# Patient Record
Sex: Male | Born: 2000
Health system: Southern US, Community
[De-identification: ages and names within clinical notes are randomized; demographics above are authoritative.]

## PROBLEM LIST (undated history)

## (undated) DIAGNOSIS — J45909 Unspecified asthma, uncomplicated: Secondary | ICD-10-CM

## (undated) DIAGNOSIS — F909 Attention-deficit hyperactivity disorder, unspecified type: Secondary | ICD-10-CM

---
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
## 2022-04-24 ENCOUNTER — Encounter (HOSPITAL_COMMUNITY): Payer: Self-pay

## 2022-04-24 ENCOUNTER — Emergency Department (HOSPITAL_COMMUNITY)
Admission: EM | Admit: 2022-04-24 | Discharge: 2022-04-24 | Discharge status: Against Medical Advice | Payer: Federal, State, Local not specified - PPO | Attending: Emergency Medicine | Admitting: Emergency Medicine

## 2022-04-24 DIAGNOSIS — Z5321 Procedure and treatment not carried out due to patient leaving prior to being seen by health care provider: Secondary | ICD-10-CM | POA: Diagnosis not present

## 2022-04-24 DIAGNOSIS — R42 Dizziness and giddiness: Secondary | ICD-10-CM | POA: Insufficient documentation

## 2022-04-24 LAB — CBC
HCT: 51.4 % (ref 39.0–52.0)
Hemoglobin: 17.1 g/dL — ABNORMAL HIGH (ref 13.0–17.0)
MCH: 28.6 pg (ref 26.0–34.0)
MCHC: 33.3 g/dL (ref 30.0–36.0)
MCV: 86 fL (ref 80.0–100.0)
Platelets: 206 10*3/uL (ref 150–400)
RBC: 5.98 MIL/uL — ABNORMAL HIGH (ref 4.22–5.81)
RDW: 13.3 % (ref 11.5–15.5)
WBC: 5.9 10*3/uL (ref 4.0–10.5)
nRBC: 0 % (ref 0.0–0.2)

## 2022-04-24 LAB — BASIC METABOLIC PANEL
Anion gap: 7 (ref 5–15)
BUN: 16 mg/dL (ref 6–20)
CO2: 27 mmol/L (ref 22–32)
Calcium: 9.7 mg/dL (ref 8.9–10.3)
Chloride: 103 mmol/L (ref 98–111)
Creatinine, Ser: 1.09 mg/dL (ref 0.61–1.24)
GFR, Estimated: >60 mL/min (ref >60)
Glucose, Bld: 90 mg/dL (ref 70–99)
Potassium: 4 mmol/L (ref 3.5–5.1)
Sodium: 137 mmol/L (ref 135–145)

## 2022-04-24 LAB — MAGNESIUM: Magnesium: 2.1 mg/dL (ref 1.7–2.4)

## 2022-04-24 NOTE — ED Provider Triage Note (Signed)
Emergency Medicine Provider Triage Evaluation Note  Jason Simpson , a 21 y.o. male  was evaluated in triage.  Pt complains of dizziness which began 2 days ago.  Scribes the entire room is spinning, states this is exacerbated with lying flat as he feels like his eyes focusing because the room to spin more.  Alleviated with sitting up, reports he has been falling asleep sitting up.  Does not have any prior history of heart disease.  No history of IV drug use.  Denies any prior history of tobacco use  Review of Systems  Positive: dizziness Negative: Chest pain, sob  Physical Exam  BP (!) 146/70 (BP Location: Left Arm)   Pulse 89   Temp 99.4 F (37.4 C) (Oral)   Resp 18   Ht 6' (1.829 m)   Wt 106.1 kg   SpO2 99%   BMI 31.74 kg/m  Gen:   Awake, no distress   Resp:  Normal effort  MSK:   Moves extremities without difficulty  Other:    Medical Decision Making  Medically screening exam initiated at 3:40 PM.  Appropriate orders placed.  MARCELO ICKES was informed that the remainder of the evaluation will be completed by another provider, this initial triage assessment does not replace that evaluation, and the importance of remaining in the ED until their evaluation is complete.     Janeece Fitting, PA-C 04/24/22 1544

## 2022-04-24 NOTE — ED Triage Notes (Signed)
Pt arrived via POV, c/o dizziness. States hx of vertigo, but this feels slightly different than normal vertigo sx.

## 2022-04-25 ENCOUNTER — Emergency Department (HOSPITAL_COMMUNITY): Payer: Federal, State, Local not specified - PPO

## 2022-04-25 ENCOUNTER — Encounter (HOSPITAL_BASED_OUTPATIENT_CLINIC_OR_DEPARTMENT_OTHER): Payer: Self-pay

## 2022-04-25 ENCOUNTER — Emergency Department (HOSPITAL_BASED_OUTPATIENT_CLINIC_OR_DEPARTMENT_OTHER)
Admission: EM | Admit: 2022-04-25 | Discharge: 2022-04-25 | Disposition: A | Discharge status: Home / Self Care | Payer: Federal, State, Local not specified - PPO | Attending: Emergency Medicine | Admitting: Emergency Medicine

## 2022-04-25 ENCOUNTER — Other Ambulatory Visit: Payer: Self-pay

## 2022-04-25 DIAGNOSIS — R42 Dizziness and giddiness: Secondary | ICD-10-CM | POA: Insufficient documentation

## 2022-04-25 MED ORDER — MECLIZINE HCL 25 MG PO TABS
25.0000 mg | ORAL_TABLET | Freq: Once | ORAL | Status: AC
  Administered 2022-04-25: 25 mg via ORAL
  Filled 2022-04-25: qty 1

## 2022-04-25 MED ORDER — MECLIZINE HCL 25 MG PO TABS: 25.0000 mg | ORAL_TABLET | Freq: Three times a day (TID) | ORAL | 0 refills | Status: DC | PRN

## 2022-04-25 MED ORDER — GADOPICLENOL 0.5 MMOL/ML IV SOLN
10.0000 mL | Freq: Once | INTRAVENOUS | Status: AC | PRN
  Administered 2022-04-25: 10 mL via INTRAVENOUS

## 2022-04-25 NOTE — ED Notes (Signed)
Handoff report given to [facility] 

## 2022-04-25 NOTE — ED Triage Notes (Signed)
Onset three days of dizziness worse while lying down.  Fullness in head  States "like my head is in a fog"

## 2022-04-25 NOTE — ED Notes (Signed)
Handoff report given to [facility] [facility] ER

## 2022-04-25 NOTE — ED Notes (Signed)
Rassessed patient's dizziness. No improvement with meds. MD aware.

## 2022-04-25 NOTE — ED Notes (Signed)
Pt arrives via [facility] from [place] here for MRI and further eval of dizziness. Given meclizine earlier without improvement.

## 2022-04-25 NOTE — ED Notes (Signed)
[facility] at bedside 

## 2022-04-25 NOTE — ED Provider Notes (Signed)
[facility] EMERGENCY DEPARTMENT Provider Note   CSN: [id number] Arrival date & time: 04/25/22  0809     History Chief Complaint  Patient presents with   Dizziness    HPI Jason Simpson is a 21 y.o. male presenting for episodes of dizziness.  He has a minimal medical history.  He endorses 3 days of paroxysms of dizziness worse with motion.  He denies presyncope but endorses vertiginous symptoms of spinning.  He denies fevers or chills nausea or vomiting, syncope shortness of breath.  Is otherwise ambulatory tolerating p.o. intake.  He states its worse when he moves or sits up from laying down or rolls over to his side.  Denies any other medical problems.   Patient's recorded medical, surgical, social, medication list and allergies were reviewed in the Snapshot window as part of the initial history.   Review of Systems   Review of Systems  Constitutional:  Negative for chills and fever.  HENT:  Negative for ear pain and sore throat.   Eyes:  Negative for pain and visual disturbance.  Respiratory:  Negative for cough and shortness of breath.   Cardiovascular:  Negative for chest pain and palpitations.  Gastrointestinal:  Negative for abdominal pain and vomiting.  Genitourinary:  Negative for dysuria and hematuria.  Musculoskeletal:  Negative for arthralgias and back pain.  Skin:  Negative for color change and rash.  Neurological:  Positive for dizziness. Negative for seizures and syncope.  All other systems reviewed and are negative.   Physical Exam Updated Vital Signs BP 131/88 (BP Location: Right Arm)   Pulse 65   Temp 98 F (36.7 C) (Oral)   Resp (!) 22   Ht 6' (1.829 m)   Wt 106.1 kg   SpO2 100%   BMI 31.72 kg/m  Physical Exam Vitals and nursing note reviewed.  Constitutional:      General: He is not in acute distress.    Appearance: He is well-developed.  HENT:     Head: Normocephalic and atraumatic.  Eyes:     Conjunctiva/sclera:  Conjunctivae normal.  Cardiovascular:     Rate and Rhythm: Normal rate and regular rhythm.     Heart sounds: No murmur heard. Pulmonary:     Effort: Pulmonary effort is normal. No respiratory distress.     Breath sounds: Normal breath sounds.  Abdominal:     Palpations: Abdomen is soft.     Tenderness: There is no abdominal tenderness.  Musculoskeletal:        General: No swelling.     Cervical back: Neck supple.  Skin:    General: Skin is warm and dry.     Capillary Refill: Capillary refill takes less than 2 seconds.  Neurological:     General: No focal deficit present.     Mental Status: He is alert and oriented to person, place, and time.     Cranial Nerves: No cranial nerve deficit.     Motor: No weakness.     Coordination: Coordination normal.     Gait: Gait normal.     Deep Tendon Reflexes: Reflexes normal.     Comments: Head impulse test with a corrective saccade Nystagmus with leftward beats of nystagmus provoked with head rotation Test of skew negative   Psychiatric:        Mood and Affect: Mood normal.      ED Course/ Medical Decision Making/ A&P    Procedures Procedures   Medications Ordered in ED Medications  meclizine (ANTIVERT) tablet 25 mg (25 mg Oral Given 04/25/22 0915)  gadopiclenol (VUEWAY) 0.5 MMOL/ML solution 10 mL (10 mLs Intravenous Contrast Given 04/25/22 1338)    Medical Decision Making:    Jason Simpson is a 21 y.o. male who presented to the ED today with dizziness detailed above.     Patient's presentation is complicated by their history of migraines.  Patient placed on continuous vitals and telemetry monitoring while in ED which was reviewed periodically.   Complete initial physical exam performed, notably the patient  was HDS in NAD.  Hints exam reassuring at this time   Reviewed and confirmed nursing documentation for past medical history, family history, social history.    Initial Assessment:   With the patient's presentation  of dizziness, reassuring hints, most likely diagnosis is peripheral etiology of vertigo including vestibular neuritis versus BPPV. Other diagnoses were considered including (but not limited to) atypical vestibular migraines, CVA, vascular dissection. These are considered less likely due to history of present illness and physical exam findings.   This is most consistent with an acute life/limb threatening illness complicated by underlying chronic conditions.  Initial Plan:  Patient consulted with neurology by outside emergency department, they recommended MRIs for restratification between these diseases. Patient is already been treated with meclizine with symptomatic improvement MRI ordered  Initial Study Results:    Radiology  All images reviewed independently. Agree with radiology report at this time.   MR Brain W and Wo Contrast  Result Date: 04/25/2022 CLINICAL DATA:  Dizziness EXAM: MRI HEAD WITHOUT CONTRAST MRA HEAD WITHOUT CONTRAST MRA NECK WITHOUT AND WITH CONTRAST TECHNIQUE: Multiplanar, multi-echo pulse sequences of the brain and surrounding structures were acquired without intravenous contrast. Angiographic images of the Circle of Willis were acquired using MRA technique without intravenous contrast. Angiographic images of the neck were acquired using MRA technique without and with intravenous contrast. Carotid stenosis measurements (when applicable) are obtained utilizing NASCET criteria, using the distal internal carotid diameter as the denominator. CONTRAST:  10 ml Vueway COMPARISON:  None Available. FINDINGS: MRI HEAD FINDINGS Brain: No acute infarction, hemorrhage, hydrocephalus, extra-axial collection or mass lesion. No pathologic intracranial enhancement. Vascular: Normal flow voids. Skull and upper cervical spine: Normal marrow signal. Sinuses/Orbits: No acute or significant finding. Other: None. MRA HEAD FINDINGS Anterior circulation: No stenosis, occlusion, or aneurysm Posterior  circulation: No stenosis, occlusion, or aneurysm Anatomic variants: No stenosis, occlusion, or aneurysm MRA NECK FINDINGS Aortic arch: Standard 3 vessel arch. Normal appearance of the branch vessels. Right carotid system: No hemodynamically significant stenosis. Left carotid system: No hemodynamically significant stenosis. Vertebral arteries: No hemodynamically significant stenosis. Other: None IMPRESSION: 1.  No acute intracranial abnormality. 2.  No hemodynamically significant stenosis in the neck. 3.  No evidence of intracranial vessel occlusion or aneurysm. Electronically Signed   By: Lorenza Cambridge M.D.   On: 04/25/2022 13:58   MR ANGIO HEAD WO CONTRAST  Result Date: 04/25/2022 CLINICAL DATA:  Dizziness EXAM: MRI HEAD WITHOUT CONTRAST MRA HEAD WITHOUT CONTRAST MRA NECK WITHOUT AND WITH CONTRAST TECHNIQUE: Multiplanar, multi-echo pulse sequences of the brain and surrounding structures were acquired without intravenous contrast. Angiographic images of the Circle of Willis were acquired using MRA technique without intravenous contrast. Angiographic images of the neck were acquired using MRA technique without and with intravenous contrast. Carotid stenosis measurements (when applicable) are obtained utilizing NASCET criteria, using the distal internal carotid diameter as the denominator. CONTRAST:  10 ml Vueway COMPARISON:  None Available. FINDINGS: MRI HEAD  FINDINGS Brain: No acute infarction, hemorrhage, hydrocephalus, extra-axial collection or mass lesion. No pathologic intracranial enhancement. Vascular: Normal flow voids. Skull and upper cervical spine: Normal marrow signal. Sinuses/Orbits: No acute or significant finding. Other: None. MRA HEAD FINDINGS Anterior circulation: No stenosis, occlusion, or aneurysm Posterior circulation: No stenosis, occlusion, or aneurysm Anatomic variants: No stenosis, occlusion, or aneurysm MRA NECK FINDINGS Aortic arch: Standard 3 vessel arch. Normal appearance of the branch  vessels. Right carotid system: No hemodynamically significant stenosis. Left carotid system: No hemodynamically significant stenosis. Vertebral arteries: No hemodynamically significant stenosis. Other: None IMPRESSION: 1.  No acute intracranial abnormality. 2.  No hemodynamically significant stenosis in the neck. 3.  No evidence of intracranial vessel occlusion or aneurysm. Electronically Signed   By: Lorenza Cambridge M.D.   On: 04/25/2022 13:58   MR Angiogram Neck W or Wo Contrast  Result Date: 04/25/2022 CLINICAL DATA:  Dizziness EXAM: MRI HEAD WITHOUT CONTRAST MRA HEAD WITHOUT CONTRAST MRA NECK WITHOUT AND WITH CONTRAST TECHNIQUE: Multiplanar, multi-echo pulse sequences of the brain and surrounding structures were acquired without intravenous contrast. Angiographic images of the Circle of Willis were acquired using MRA technique without intravenous contrast. Angiographic images of the neck were acquired using MRA technique without and with intravenous contrast. Carotid stenosis measurements (when applicable) are obtained utilizing NASCET criteria, using the distal internal carotid diameter as the denominator. CONTRAST:  10 ml Vueway COMPARISON:  None Available. FINDINGS: MRI HEAD FINDINGS Brain: No acute infarction, hemorrhage, hydrocephalus, extra-axial collection or mass lesion. No pathologic intracranial enhancement. Vascular: Normal flow voids. Skull and upper cervical spine: Normal marrow signal. Sinuses/Orbits: No acute or significant finding. Other: None. MRA HEAD FINDINGS Anterior circulation: No stenosis, occlusion, or aneurysm Posterior circulation: No stenosis, occlusion, or aneurysm Anatomic variants: No stenosis, occlusion, or aneurysm MRA NECK FINDINGS Aortic arch: Standard 3 vessel arch. Normal appearance of the branch vessels. Right carotid system: No hemodynamically significant stenosis. Left carotid system: No hemodynamically significant stenosis. Vertebral arteries: No hemodynamically  significant stenosis. Other: None IMPRESSION: 1.  No acute intracranial abnormality. 2.  No hemodynamically significant stenosis in the neck. 3.  No evidence of intracranial vessel occlusion or aneurysm. Electronically Signed   By: Lorenza Cambridge M.D.   On: 04/25/2022 13:58     Final Assessment and Plan:   Patient overall well-appearing at this time.  No symptoms when at rest, able to provoke syndrome by having patient's stand and then rapidly lay back on his back leaning over towards his right side.  Leftward beating nystagmus appreciated.  I believe this is more consistent with peripheral vertigo versus vestibular neuritis.  MRI is grossly reassuring at this time with no evidence of intracranial disease.   She had medical decision making with patient.  Given gross improvement of symptoms, I believe the patient outpatient care with plan to follow-up with neurology.  Patient feels comfortable with this ongoing outpatient care and management.  Patient discharged with no further acute events.  Clinical Impression:  1. Dizziness      Discharge   Final Clinical Impression(s) / ED Diagnoses Final diagnoses:  Dizziness    Rx / DC Orders ED Discharge Orders          Ordered    Ambulatory referral to Neurology       Comments: An appointment is requested in approximately: 1 week   04/25/22 1444    meclizine (ANTIVERT) 25 MG tablet  3 times daily PRN        04/25/22 1447  Glyn Ade, MD 04/25/22 [phone]

## 2022-04-25 NOTE — ED Notes (Signed)
Patient transported to MRI 

## 2022-04-25 NOTE — ED Notes (Signed)
Attempt to call report to charge nurse at [facility]

## 2022-04-25 NOTE — ED Notes (Signed)
Patient states was seen yesterday at [facility] and had Blood Work done but left due to the wait

## 2022-04-25 NOTE — ED Provider Notes (Signed)
[facility] EMERGENCY DEPT Provider Note   CSN: [id number] Arrival date & time: 04/25/22  0809     History  Chief Complaint  Patient presents with   Dizziness    Jason Simpson is a 21 y.o. male.  The history is provided by the patient and medical records. No language interpreter was used.  Dizziness Quality:  Room spinning Severity:  Severe Onset quality:  Gradual Duration:  3 days Timing:  Intermittent Progression:  Waxing and waning Chronicity:  New Context: head movement   Context: not with loss of consciousness   Relieved by:  Being still and change in position Worsened by:  Lying down and eye movement Ineffective treatments:  None tried Associated symptoms: diarrhea (one episode) and headaches (mild intermittnet)   Associated symptoms: no chest pain, no nausea, no palpitations, no shortness of breath, no syncope, no vision changes, no vomiting and no weakness   Risk factors: hx of vertigo (he thinks so)        Home Medications Prior to Admission medications   Not on File      Allergies    Eggs or egg-derived products and Shellfish allergy    Review of Systems   Review of Systems  Constitutional:  Negative for chills, fatigue and fever.  HENT:  Negative for congestion.   Eyes:  Negative for visual disturbance.  Respiratory:  Negative for cough, chest tightness and shortness of breath.   Cardiovascular:  Negative for chest pain, palpitations and syncope.  Gastrointestinal:  Positive for diarrhea (one episode). Negative for constipation, nausea and vomiting.  Genitourinary:  Negative for flank pain.  Musculoskeletal:  Negative for back pain, neck pain and neck stiffness.  Skin:  Negative for rash and wound.  Neurological:  Positive for dizziness and headaches (mild intermittnet). Negative for seizures, syncope, facial asymmetry, speech difficulty, weakness, light-headedness and numbness.  Psychiatric/Behavioral:  Negative for agitation.    All other systems reviewed and are negative.   Physical Exam Updated Vital Signs BP (!) 145/72 (BP Location: Right Arm)   Pulse 65   Temp 98.1 F (36.7 C) (Oral)   Resp 18   Ht 6' (1.829 m)   Wt 106.1 kg   SpO2 99%   BMI 31.72 kg/m  Physical Exam Vitals and nursing note reviewed.  Constitutional:      General: He is not in acute distress.    Appearance: He is well-developed. He is not ill-appearing, toxic-appearing or diaphoretic.  HENT:     Head: Normocephalic and atraumatic.     Nose: No congestion or rhinorrhea.     Mouth/Throat:     Mouth: Mucous membranes are moist.     Pharynx: No oropharyngeal exudate or posterior oropharyngeal erythema.  Eyes:     Extraocular Movements:     Right eye: Nystagmus present. Normal extraocular motion.     Left eye: Nystagmus present. Normal extraocular motion.     Conjunctiva/sclera: Conjunctivae normal.     Pupils: Pupils are equal, round, and reactive to light.  Neck:     Vascular: No carotid bruit.  Cardiovascular:     Rate and Rhythm: Normal rate and regular rhythm.     Heart sounds: No murmur heard. Pulmonary:     Effort: Pulmonary effort is normal. No respiratory distress.     Breath sounds: Normal breath sounds. No wheezing, rhonchi or rales.  Chest:     Chest wall: No tenderness.  Abdominal:     General: Abdomen is flat.  Palpations: Abdomen is soft.     Tenderness: There is no abdominal tenderness. There is no guarding or rebound.  Musculoskeletal:        General: No swelling or tenderness.     Cervical back: Neck supple. No tenderness.     Right lower leg: No edema.     Left lower leg: No edema.  Skin:    General: Skin is warm and dry.     Capillary Refill: Capillary refill takes less than 2 seconds.     Findings: No erythema or rash.  Neurological:     General: No focal deficit present.     Mental Status: He is alert.  Psychiatric:        Mood and Affect: Mood normal.     ED Results / Procedures /  Treatments   Labs (all labs ordered are listed, but only abnormal results are displayed) Labs Reviewed - No data to display  EKG None  Radiology No results found.  Procedures Procedures    Medications Ordered in ED Medications  meclizine (ANTIVERT) tablet 25 mg (25 mg Oral Given 04/25/22 0915)  gadopiclenol (VUEWAY) 0.5 MMOL/ML solution 10 mL (10 mLs Intravenous Contrast Given 04/25/22 1338)    ED Course/ Medical Decision Making/ A&P                           Medical Decision Making Amount and/or Complexity of Data Reviewed Radiology: ordered.  Risk Prescription drug management.    Jason Simpson is a 21 y.o. male with a past medical history significant for asthma and previous vertiginous-like sensation who presents with several days of room spinning dizziness.  According to patient, since Monday, he has been having feeling like his head was spinning especially when he lays his head back or turns his head to the right.  He said he has had some mild dizziness before but that was fixed when he had his ears cleaned out.  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  He reports he waited for many hours and unfortunately was unable to wait longer for evaluation.  He did have some screening blood work that was overall reassuring on my viewing and review.  Patient reports that his symptoms have been persistent with waxing and waning and he says that sometimes he feels like his head is in a fog.  He does report intermittent headache but not persistent.  Denies neck pain or neck stiffness.  Denies any recent neck manipulation, chiropractor use, or neck massage.  Denies recent trauma.  On exam, lungs clear and chest nontender.  Abdomen nontender.  No focal neurologic deficit initially with intact sensation and strength in extremities.  Normal finger-nose-finger testing.  Symmetric smile.  Clear speech.  Pupils symmetric and reactive  with normal extraocular movements however when he did have his head turned horizontally, he had some mild nystagmus at times.  It was brief however not persistent.  No carotid bruit.  No neck tenderness.  Patient otherwise well-appearing.  Given his description of symptoms being worse with his head all the way back and with tilting his head to the right, I do suspect BPPV primarily.  His ears did not show evidence of any abnormalities in his ears externally on my evaluation as well.  We agreed to try an Epley maneuver and a dose of meclizine to see if this helps.  Epley maneuver was performed  without difficulty and we will reassess patient.  If patient still has persistent dizziness, may touch base with neurology to discuss if he needs imaging such as CTA to look for vertebral basilar trouble or even transfer for MRI given this persistent dizziness with some headache and "head fogginess".  Anticipate reassessment to determine disposition.  10:31 AM Patient reassessed and patient says although it is possibly slightly better it is still feeling dizzy with room spinning and some head discomfort.  We will speak to neurology and determine if more advanced imaging is needed versus trial of meclizine and outpatient follow-up.  10:35 AM Just spoke to Dr. Quinn Axe with neurology.  She listened to the story and exam and does feel that imaging is indicated and she would recommend MRI brain with and without contrast and MRA head and neck to rule out different pathologies causing this as opposed to BPPV.  We will offer this to patient.  10:44 AM Spoke to patient and his father on the phone, they do agree to ED to ED transfer for MRI evaluation today.  He had labs yesterday with reassuring creatinine so will not reorder labs at this time.  Spoke to Dr. Maryan Rued who accept the patient for ED to ED transfer.  Will order MRIs and patient will be transferred for imaging.  If concerning findings are discovered, neurology  requested a consult but if it is reassuring, anticipate discharge with improvement for meclizine and a school note and instructions to follow-up with outpatient neurology.          Final Clinical Impression(s) / ED Diagnoses Final diagnoses:  Dizziness     Clinical Impression: 1. Dizziness     Disposition: ED to ED transfer for MRI.  Accepted by Maryan Rued MD.  This note was prepared with assistance of Dragon voice recognition software. Occasional wrong-word or sound-a-like substitutions may have occurred due to the inherent limitations of voice recognition software.     Toshie Demelo, Gwenyth Allegra, MD 04/25/22 1536

## 2022-05-07 NOTE — Progress Notes (Signed)
TOC CSW spoke with pt in regard to 04/25/2022 visit.  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  Pt stated he would come by on 05/08/2022 to get a note.  Phillippa Straub Tarpley-Carter, MSW, LCSW-A Pronouns:  She/Her/Hers [facility] HealthTransitions of Care Clinical Social Worker Direct Number:  [phone] [email or web address]

## 2022-05-14 ENCOUNTER — Emergency Department (HOSPITAL_BASED_OUTPATIENT_CLINIC_OR_DEPARTMENT_OTHER)
Admission: EM | Admit: 2022-05-14 | Discharge: 2022-05-14 | Disposition: A | Discharge status: Home / Self Care | Payer: Federal, State, Local not specified - PPO | Attending: Emergency Medicine | Admitting: Emergency Medicine

## 2022-05-14 ENCOUNTER — Other Ambulatory Visit: Payer: Self-pay

## 2022-05-14 ENCOUNTER — Encounter (HOSPITAL_BASED_OUTPATIENT_CLINIC_OR_DEPARTMENT_OTHER): Payer: Self-pay | Admitting: Emergency Medicine

## 2022-05-14 DIAGNOSIS — R0602 Shortness of breath: Secondary | ICD-10-CM | POA: Diagnosis not present

## 2022-05-14 DIAGNOSIS — J4540 Moderate persistent asthma, uncomplicated: Secondary | ICD-10-CM | POA: Insufficient documentation

## 2022-05-14 HISTORY — DX: Attention-deficit hyperactivity disorder, unspecified type: F90.9

## 2022-05-14 MED ORDER — ALBUTEROL SULFATE HFA 108 (90 BASE) MCG/ACT IN AERS: 2.0000 | INHALATION_SPRAY | Freq: Four times a day (QID) | RESPIRATORY_TRACT | 2 refills | Status: DC | PRN

## 2022-05-14 MED ORDER — AEROCHAMBER PLUS FLO-VU MISC
1.0000 | Freq: Once | Status: AC
  Administered 2022-05-14: 1
  Filled 2022-05-14: qty 1

## 2022-05-14 MED ORDER — ALBUTEROL SULFATE HFA 108 (90 BASE) MCG/ACT IN AERS
2.0000 | INHALATION_SPRAY | RESPIRATORY_TRACT | Status: DC | PRN
  Administered 2022-05-14: 2 via RESPIRATORY_TRACT
  Filled 2022-05-14: qty 6.7

## 2022-05-14 NOTE — ED Provider Notes (Signed)
[facility] EMERGENCY DEPT Provider Note   CSN: [id number] Arrival date & time: 05/14/22  1916     History  Chief Complaint  Patient presents with   Asthma    Jason Simpson is a 21 y.o. male.  Patient complains of an asthma attack he ran out of his albuterol inhaler he went to the pharmacy for refill but was out of refills.  Patient states he no longer has a physician.  He was seen by his pediatrician until he turned 89  The history is provided by the patient. No language interpreter was used.  Asthma This is a new problem. The problem occurs constantly. The problem has not changed since onset.Associated symptoms include shortness of breath. Nothing aggravates the symptoms.       Home Medications Prior to Admission medications   Medication Sig Start Date End Date Taking? Authorizing Provider  albuterol (VENTOLIN HFA) 108 (90 Base) MCG/ACT inhaler Inhale 2 puffs into the lungs every 6 (six) hours as needed for wheezing or shortness of breath. 05/14/22  Yes Elson Areas, PA-C  meclizine (ANTIVERT) 25 MG tablet Take 1 tablet (25 mg total) by mouth 3 (three) times daily as needed for dizziness. 04/25/22   Glyn Ade, MD      Allergies    Eggs or egg-derived products and Shellfish allergy    Review of Systems   Review of Systems  Respiratory:  Positive for shortness of breath.   All other systems reviewed and are negative.   Physical Exam Updated Vital Signs BP (!) 120/59 (BP Location: Right Arm)   Pulse 60   Temp 98.2 F (36.8 C) (Oral)   Resp 18   SpO2 100%  Physical Exam Vitals and nursing note reviewed.  Constitutional:      Appearance: He is well-developed.  HENT:     Head: Normocephalic.  Cardiovascular:     Rate and Rhythm: Normal rate.  Pulmonary:     Effort: Pulmonary effort is normal.  Abdominal:     General: There is no distension.  Musculoskeletal:        General: Normal range of motion.     Cervical back: Normal  range of motion.  Neurological:     General: No focal deficit present.     Mental Status: He is alert and oriented to person, place, and time.  Psychiatric:        Mood and Affect: Mood normal.     ED Results / Procedures / Treatments   Labs (all labs ordered are listed, but only abnormal results are displayed) Labs Reviewed - No data to display  EKG None  Radiology No results found.  Procedures Procedures    Medications Ordered in ED Medications  albuterol (VENTOLIN HFA) 108 (90 Base) MCG/ACT inhaler 2 puff (2 puffs Inhalation Given 05/14/22 2244)  aerochamber plus with mask device 1 each (1 each Other Given 05/14/22 2245)    ED Course/ Medical Decision Making/ A&P                           Medical Decision Making Patient complains of an asthma attack.  He reports he ran out of his albuterol and is here because he needs an inhaler he denies any current shortness of breath  Risk Prescription drug management. Risk Details: Patient is given an albuterol inhaler and AeroChamber here.  Patient is given a prescription for albuterol inhalers.  He is advised to establish primary care  he is advised to return if any problems           Final Clinical Impression(s) / ED Diagnoses Final diagnoses:  Moderate persistent asthma without complication    Rx / DC Orders ED Discharge Orders          Ordered    albuterol (VENTOLIN HFA) 108 (90 Base) MCG/ACT inhaler  Every 6 hours PRN        05/14/22 2240           An After Visit Summary was printed and given to the patient.    Fransico Meadow, PA-C 86/57/84 [id number]    Gray, [place], DO 95/28/41 1829

## 2022-05-14 NOTE — ED Triage Notes (Signed)
Asthma exacerbation. Unable to get refill of daily inhaler. Ran out. Slight wheezing and asking for refill.

## 2022-12-20 DIAGNOSIS — J4530 Mild persistent asthma, uncomplicated: Secondary | ICD-10-CM | POA: Diagnosis not present

## 2022-12-20 DIAGNOSIS — H6992 Unspecified Eustachian tube disorder, left ear: Secondary | ICD-10-CM | POA: Diagnosis not present

## 2024-01-06 ENCOUNTER — Ambulatory Visit: Admitting: Student in an Organized Health Care Education/Training Program

## 2024-01-06 ENCOUNTER — Encounter: Payer: Self-pay | Admitting: Student in an Organized Health Care Education/Training Program

## 2024-01-06 VITALS — BP 127/72 | HR 79 | Ht 71.3 in | Wt 226.0 lb

## 2024-01-06 DIAGNOSIS — L709 Acne, unspecified: Secondary | ICD-10-CM | POA: Insufficient documentation

## 2024-01-06 DIAGNOSIS — H6122 Impacted cerumen, left ear: Secondary | ICD-10-CM | POA: Diagnosis not present

## 2024-01-06 DIAGNOSIS — J4520 Mild intermittent asthma, uncomplicated: Secondary | ICD-10-CM

## 2024-01-06 DIAGNOSIS — F900 Attention-deficit hyperactivity disorder, predominantly inattentive type: Secondary | ICD-10-CM | POA: Diagnosis not present

## 2024-01-06 DIAGNOSIS — F909 Attention-deficit hyperactivity disorder, unspecified type: Secondary | ICD-10-CM | POA: Insufficient documentation

## 2024-01-06 DIAGNOSIS — L700 Acne vulgaris: Secondary | ICD-10-CM | POA: Diagnosis not present

## 2024-01-06 DIAGNOSIS — J45909 Unspecified asthma, uncomplicated: Secondary | ICD-10-CM | POA: Insufficient documentation

## 2024-01-06 MED ORDER — ALBUTEROL SULFATE HFA 108 (90 BASE) MCG/ACT IN AERS: 2.0000 | INHALATION_SPRAY | Freq: Four times a day (QID) | RESPIRATORY_TRACT | 2 refills | Status: AC | PRN

## 2024-01-06 MED ORDER — ADAPALENE 0.1 % EX GEL: Freq: Every day | CUTANEOUS | 1 refills | Status: DC

## 2024-01-06 MED ORDER — ATOMOXETINE HCL 40 MG PO CAPS: 40.0000 mg | ORAL_CAPSULE | Freq: Every day | ORAL | 0 refills | Status: DC

## 2024-01-06 MED ORDER — FLUTICASONE PROPIONATE HFA 44 MCG/ACT IN AERO: 1.0000 | INHALATION_SPRAY | Freq: Two times a day (BID) | RESPIRATORY_TRACT | 2 refills | Status: AC

## 2024-01-06 NOTE — Assessment & Plan Note (Signed)
 Chronic issue, currently untreated.  As a teenager he was using Vyvanse and then Adderall.  Did not tolerate the stimulants well as it caused him headaches and irritability.  He is starting college again in the fall, nervous about returning to the classroom because of inattention issues.  We decided to start a nonstimulant medication for ADD.  Will start with Strattera 40 mg daily.  We talked about the side effects of this medication.  He has no history of cardiac or psychiatric condition.  Will follow-up in 4 weeks and obtain records from his pediatrician about prior attention testing.

## 2024-01-06 NOTE — Progress Notes (Signed)
 New Patient Office Visit  Subjective    Patient ID: Jason Simpson, male    DOB: 2000-08-22  Age: 23 y.o. MRN: [id number]  CC:  Chief Complaint  Patient presents with   Establish Care    Patient would like to have inhaler prescriptions refilled     HPI  23 Jason Simpson presents to establish care  23 year old person here for management of asthma.  Reports doing well, no acute concerns about his health today.  Previously this was managed by his pediatrician.  He has been living in [place] all his life.  Currently lives with his father and stepmother.  He finished a couple semesters at [employer], took a few semesters off to work full-time.  Planning to go to [employer] in the fall, studying a sound engineering.  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  Denies tobacco use, no alcohol use.  No other substance use.  Not sexually active in about 2 years.  He has no concern for sexually transmitted infection.  No other history of major medical problems or admissions.  He has a history of mild asthma.  Was doing very well using Flovent every day.  Ran out of medication about a week ago.  No recent exacerbations.  Usually uses albuterol  less than once per week.  He has a history of ADHD.  This was treated as a teenager with various stimulants.  Reports mostly this is an issue with inattention.  He has difficulty focusing in classrooms, inattentive to some details, becomes fidgety at times.  This can be distracting to others.  In the past he was treated with Vyvanse and Adderall, but he had side effects to those medication including irritability and headaches.   Outpatient Encounter Medications as of 01/06/2024  Medication Sig   adapalene (DIFFERIN) 0.1 % gel Apply topically at bedtime.   atomoxetine (STRATTERA) 40 MG capsule Take 1 capsule (40 mg total) by mouth daily.   [DISCONTINUED] albuterol  (VENTOLIN  HFA) 108 (90 Base) MCG/ACT inhaler Inhale 2 puffs into the  lungs every 6 (six) hours as needed for wheezing or shortness of breath.   [DISCONTINUED] fluticasone (FLOVENT HFA) 44 MCG/ACT inhaler Inhale 1 puff into the lungs 2 (two) times daily.   albuterol  (VENTOLIN  HFA) 108 (90 Base) MCG/ACT inhaler Inhale 2 puffs into the lungs every 6 (six) hours as needed for wheezing or shortness of breath.   fluticasone (FLOVENT HFA) 44 MCG/ACT inhaler Inhale 1 puff into the lungs 2 (two) times daily.   [DISCONTINUED] meclizine  (ANTIVERT ) 25 MG tablet Take 1 tablet (25 mg total) by mouth 3 (three) times daily as needed for dizziness. (Patient not taking: Reported on 01/06/2024)   No facility-administered encounter medications on file as of 01/06/2024.    Past Medical History:  Diagnosis Date   ADHD    Asthma     History reviewed. No pertinent surgical history.  History reviewed. No pertinent family history.  Social History   Socioeconomic History   Marital status: Single    Spouse name: Not on file   Number of children: Not on file   Years of education: Not on file   Highest education level: Not on file  Occupational History   Not on file  Tobacco Use   Smoking status: Never   Smokeless tobacco: Never  Substance and Sexual Activity   Alcohol use: Not Currently   Drug use: Not Currently   Sexual activity: Not on file  Other Topics Concern  Not on file  Social History Narrative   Not on file   Social Drivers of Health   Financial Resource Strain: Not on file  Food Insecurity: Not on file  Transportation Needs: Not on file  Physical Activity: Not on file  Stress: Not on file  Social Connections: Not on file  Intimate Partner Violence: Not on file        Objective    BP 127/72   Pulse 79   Ht 5' 11.3" (1.811 m)   Wt 226 lb (102.5 kg)   SpO2 99%   BMI 31.26 kg/m   Physical Exam  Gen: Well-appearing Eyes: Normal Ears: Right tympanic membrane is normal, left is impacted with cerumen, I remove most of it with a curettes, there  was some cerumen stuck on the tympanic membrane, we offered irrigation but the patient declined and will instead do the irrigation at home. Skin: Mild comedonal acne on his face, nonscarring, inclusion cyst along the jawline, mild tinea barbae on his right neck Neck: Normal thyroid, no nodules or adenopathy Heart: Regular, no murmur Lungs: Unlabored, clear throughout Abd: Soft, nontender, no organomegaly Ext: Warm, no edema, normal joints Neuro: Alert, conversational, full strength upper and lower extremities, normal gait Psych: Appropriate mood and affect, not anxious or depressed appearing, calm, pleasant to talk with     Assessment & Plan:   Problem List Items Addressed This Visit       High   ADHD - Primary (Chronic)   Chronic issue, currently untreated.  As a teenager he was using Vyvanse and then Adderall.  Did not tolerate the stimulants well as it caused him headaches and irritability.  He is starting college again in the fall, nervous about returning to the classroom because of inattention issues.  We decided to start a nonstimulant medication for ADD.  Will start with Strattera 40 mg daily.  We talked about the side effects of this medication.  He has no history of cardiac or psychiatric condition.  Will follow-up in 4 weeks and obtain records from his pediatrician about prior attention testing.      Relevant Medications   atomoxetine (STRATTERA) 40 MG capsule     Medium    Asthma (Chronic)   Chronic and stable.  This is intermittent mild asthma.  Will refill Flovent to be used daily and albuterol  as reliever therapy.  We did talk about anti-inflammatory reliever therapy but he decided to continue with current ICS and Saba.      Relevant Medications   fluticasone (FLOVENT HFA) 44 MCG/ACT inhaler   albuterol  (VENTOLIN  HFA) 108 (90 Base) MCG/ACT inhaler     Low   Acne (Chronic)   Chronic and stable.  He has some comedones and inclusion cysts on his face.  Nonscarring right  now.  Along the beard line also seems to have some tinea barbae.  Will get a start by using topical Differin for anti-inflammatory effects over the next 4 weeks.  If the tinea is still a problem, will treat with an antifungal.      Relevant Medications   adapalene (DIFFERIN) 0.1 % gel     Unprioritized   Cerumen debris on tympanic membrane, left   Procedure Note: Manual Removal of Impacted Cerumen Using a Curette   Indication:  Cerumen impaction causing symptoms (e.g., hearing loss, pain, tinnitus) or preventing assessment of the ear canal and tympanic membrane.  Procedure:  Explained the procedure to the patient and informed consent was obtained  Review  patient history for contraindications (e.g., nonintact tympanic membrane, history of ear surgery, anatomical abnormalities).[1]  After a position of the patient's head upright, I visualized the ear canal and cerumen using an otoscope.  I gently inserted the curette into the ear canal avoiding contact with the canal walls, and carefully scooped the cerumen, removing it in small pieces.  I reassessed the ear canal and tympanic membrane, there was no residual cerumen nor signs of trauma.  Follow-Up:  I instructed the patient to report any persistent symptoms such as pain, discharge, or hearing loss.  Schedule a follow-up appointment if necessary.        Return in about 4 weeks (around 02/03/2024).   Ether Hercules, MD

## 2024-01-06 NOTE — Assessment & Plan Note (Signed)
 Chronic and stable.  He has some comedones and inclusion cysts on his face.  Nonscarring right now.  Along the beard line also seems to have some tinea barbae.  Will get a start by using topical Differin for anti-inflammatory effects over the next 4 weeks.  If the tinea is still a problem, will treat with an antifungal.

## 2024-01-06 NOTE — Assessment & Plan Note (Signed)
 Procedure Note: Manual Removal of Impacted Cerumen Using a Curette   Indication:  Cerumen impaction causing symptoms (e.g., hearing loss, pain, tinnitus) or preventing assessment of the ear canal and tympanic membrane.  Procedure:  Explained the procedure to the patient and informed consent was obtained  Review patient history for contraindications (e.g., nonintact tympanic membrane, history of ear surgery, anatomical abnormalities).[1]  After a position of the patient's head upright, I visualized the ear canal and cerumen using an otoscope.  I gently inserted the curette into the ear canal avoiding contact with the canal walls, and carefully scooped the cerumen, removing it in small pieces.  I reassessed the ear canal and tympanic membrane, there was no residual cerumen nor signs of trauma.  Follow-Up:  I instructed the patient to report any persistent symptoms such as pain, discharge, or hearing loss.  Schedule a follow-up appointment if necessary.

## 2024-01-06 NOTE — Assessment & Plan Note (Signed)
 Chronic and stable.  This is intermittent mild asthma.  Will refill Flovent to be used daily and albuterol  as reliever therapy.  We did talk about anti-inflammatory reliever therapy but he decided to continue with current ICS and Saba.

## 2024-01-07 ENCOUNTER — Telehealth: Payer: Self-pay

## 2024-01-07 NOTE — Telephone Encounter (Signed)
 Pt is aware of action needed and will call peds to correct

## 2024-01-07 NOTE — Telephone Encounter (Signed)
 Copied from CRM [id number]. Topic: Medical Record Request - Provider/Facility Request >> Jan 06, 2024  4:33 PM Taleah C wrote: Reason for CRM: Wing Hauser from [facility] called and explained that they received the medical records request that the office sent to them. However, the patient needs to fill out a medical release form in order for them to fax any info but they cannot reach the patient. She asked if we could try to contact the patient or the parents to get in touch with him. They're callback # is [phone], if needed.

## 2024-02-03 ENCOUNTER — Ambulatory Visit: Admitting: Student in an Organized Health Care Education/Training Program

## 2024-02-04 ENCOUNTER — Ambulatory Visit: Admitting: Student in an Organized Health Care Education/Training Program

## 2024-02-04 ENCOUNTER — Encounter: Payer: Self-pay | Admitting: Student in an Organized Health Care Education/Training Program

## 2024-02-04 VITALS — BP 131/80 | HR 81 | Wt 231.0 lb

## 2024-02-04 DIAGNOSIS — F900 Attention-deficit hyperactivity disorder, predominantly inattentive type: Secondary | ICD-10-CM

## 2024-02-04 DIAGNOSIS — S1983XA Other specified injuries of vocal cord, initial encounter: Secondary | ICD-10-CM | POA: Insufficient documentation

## 2024-02-04 DIAGNOSIS — S1983XS Other specified injuries of vocal cord, sequela: Secondary | ICD-10-CM | POA: Diagnosis not present

## 2024-02-04 DIAGNOSIS — L700 Acne vulgaris: Secondary | ICD-10-CM | POA: Diagnosis not present

## 2024-02-04 LAB — POCT URINE DRUG SCREEN
Methylenedioxyamphetamine: NOT DETECTED
POC Amphetamine UR: NOT DETECTED
POC BENZODIAZEPINES UR: NOT DETECTED
POC Barbiturate UR: NOT DETECTED
POC Cocaine UR: NOT DETECTED
POC Ecstasy UR: NOT DETECTED
POC Marijuana UR: NOT DETECTED
POC Methadone UR: NOT DETECTED
POC Methamphetamine UR: NOT DETECTED
POC Opiate Ur: NOT DETECTED
POC Oxycodone UR: NOT DETECTED
POC PHENCYCLIDINE UR: NOT DETECTED
POC TRICYCLICS UR: NOT DETECTED
URINE TEMPERATURE: 97 [degF] (ref 90.0–100.0)

## 2024-02-04 MED ORDER — LISDEXAMFETAMINE DIMESYLATE 20 MG PO CAPS: 20.0000 mg | ORAL_CAPSULE | Freq: Every day | ORAL | 0 refills | Status: DC

## 2024-02-04 NOTE — Assessment & Plan Note (Signed)
 Patient has a history of phono trauma as a child and teenager.  Resulted in some intermittent hoarseness of his voice.  He had an ENT evaluation with laryngoscopy at one point, reportedly normal vocal cords. Intermittent hoarseness and soreness due to vocal strain continues to be an issue.  No new phonotrauma, counseled him on the importance of avoiding this.  No tobacco use. - Will consider referral to ENT if symptoms worsen or if he decides to pursue further evaluation

## 2024-02-04 NOTE — Assessment & Plan Note (Signed)
 Atomoxetine  discontinued due to drowsiness and dizziness, also not effective. Anticipates increased ADHD symptom management difficulty with school term.  He found stimulants more effective in the past, but struggled with side effects at the higher doses.  We decided to we try Vyvanse  at a lower dose.  He is pretty sensitive to medication changes.  Goal is to improve function at work and in the fall when he starts college. - Prescribe Vyvanse  20 mg daily. - Perform urine drug test prior to prescribing Vyvanse . - Follow up in one month to assess effectiveness and tolerance of Vyvanse .

## 2024-02-04 NOTE — Patient Instructions (Signed)
  VISIT SUMMARY: Today, we discussed your concerns about medication side effects and management for ADD, as well as your acne treatment and vocal health. We reviewed your history with different ADD medications and decided on a new plan moving forward. We also talked about your current acne treatment and potential vocal cord issues.  YOUR PLAN: -ATTENTION-DEFICIT/HYPERACTIVITY DISORDER (ADHD): ADHD is a condition that affects your ability to focus and control impulses. We decided to discontinue Atomoxetine  due to the side effects you experienced. Instead, we will start you on Vyvanse  20 mg daily. Before starting this medication, you will need to complete a urine drug test. We will follow up in one month to see how you are responding to the new medication.  -ACNE: Acne is a skin condition that occurs when hair follicles become clogged with oil and dead skin cells. You have seen improvement with Adapalene , so we will continue this treatment at 0.1% topical at bedtime. We will reassess in three months to evaluate its effectiveness and decide if any changes are needed.  -VOCAL CORD TRAUMA: Vocal cord trauma can result from excessive yelling or singing, leading to hoarseness and soreness. If your symptoms worsen or if you decide to pursue further evaluation, we can consider a referral to an ENT specialist.  INSTRUCTIONS: Please complete a urine drug test before starting Vyvanse . Follow up in one month to assess the effectiveness and tolerance of Vyvanse . We will reassess your acne treatment in three months. If your vocal symptoms worsen, consider seeing an ENT specialist for further evaluation.

## 2024-02-04 NOTE — Assessment & Plan Note (Signed)
 Improvement with Adapalene  noted, with some dryness. Prefers to continue for full assessment. - Continue Adapalene  0.1% topical at bedtime. - Reassess in three months to evaluate effectiveness and decide on any changes to the treatment plan.

## 2024-02-04 NOTE — Progress Notes (Signed)
 Established Patient Office Visit  Subjective   Patient ID: Jason Simpson, male    DOB: 04-04-2001  Age: 23 y.o. MRN: [id number]  Chief Complaint  Patient presents with   Medical Management of Chronic Issues    4 week follow up  6/09    HPI  Discussed the use of AI scribe software for clinical note transcription with the patient, who gave verbal consent to proceed.  History of Present Illness Jason Simpson is a 23 year old male who presents with concerns about medication side effects and management for ADD.  He started Strattera  four weeks ago for ADD but experienced significant drowsiness and mild dizziness, which worsened throughout the day. Attempts to manage the drowsiness by adjusting the timing of the dose were frustrating. He stopped the medication about a week and a half ago after inconsistent use during the second week.  He has a history of using Adderall, which caused irritability and headaches, and Vyvanse , which kept him awake for extended periods. He recalls taking a high dose of Vyvanse , possibly 80 mg, during high school. He is considering restarting Vyvanse  at a lower dose as he prepares to return to school, where managing ADD symptoms becomes more challenging.  He is also using adapalene  for acne, which has helped reduce acne and ingrown hairs. He believes it may be lightening dark spots but is withholding judgment until he uses it for a few more months. He notes some skin dryness but is unsure if it's related to the medication.  He has concerns about potential vocal trauma from past yelling and singing, which sometimes results in a sore throat and hoarseness. He previously saw an ENT who found no nodules. No smoking and works mostly outside [employer].  He has a history of asthma, which he mentions in passing during the conversation.      Objective:     BP 131/80   Pulse 81   Wt 231 lb (104.8 kg)   BMI 31.95 kg/m    Physical Exam  Physical  Exam Gen: Well-appearing man Skin: Moderate nonscarring acne on his face and neck, small areas of pustules, no cystic lesions visible today NECK: Thyroid normal CARDIOVASCULAR: Heart sounds normal, regular rhythm, no murmur EXTREMITIES: No leg swelling Psych: Appropriate mood and affect, not anxious or depressed appearing     Assessment & Plan:    Problem List Items Addressed This Visit       High   ADHD - Primary (Chronic)   Atomoxetine  discontinued due to drowsiness and dizziness, also not effective. Anticipates increased ADHD symptom management difficulty with school term.  He found stimulants more effective in the past, but struggled with side effects at the higher doses.  We decided to we try Vyvanse  at a lower dose.  He is pretty sensitive to medication changes.  Goal is to improve function at work and in the fall when he starts college. - Prescribe Vyvanse  20 mg daily. - Perform urine drug test prior to prescribing Vyvanse . - Follow up in one month to assess effectiveness and tolerance of Vyvanse .      Relevant Medications   lisdexamfetamine (VYVANSE ) 20 MG capsule   Other Relevant Orders   POCT Urine Drug Screen     Low   Acne (Chronic)   Improvement with Adapalene  noted, with some dryness. Prefers to continue for full assessment. - Continue Adapalene  0.1% topical at bedtime. - Reassess in three months to evaluate effectiveness and decide on any changes  to the treatment plan.      Trauma to vocal cord (Chronic)   Patient has a history of phono trauma as a child and teenager.  Resulted in some intermittent hoarseness of his voice.  He had an ENT evaluation with laryngoscopy at one point, reportedly normal vocal cords. Intermittent hoarseness and soreness due to vocal strain continues to be an issue.  No new phonotrauma, counseled him on the importance of avoiding this.  No tobacco use. - Will consider referral to ENT if symptoms worsen or if he decides to pursue further  evaluation       Return in about 4 weeks (around 03/03/2024) for ADD management.    Cleatus Debby Specking, MD

## 2024-02-06 ENCOUNTER — Ambulatory Visit: Admitting: Student in an Organized Health Care Education/Training Program

## 2024-03-03 ENCOUNTER — Ambulatory Visit: Admitting: Student in an Organized Health Care Education/Training Program

## 2024-03-12 ENCOUNTER — Encounter: Payer: Self-pay | Admitting: Student in an Organized Health Care Education/Training Program

## 2024-04-21 ENCOUNTER — Other Ambulatory Visit: Payer: Self-pay | Admitting: Student in an Organized Health Care Education/Training Program

## 2024-04-21 DIAGNOSIS — L700 Acne vulgaris: Secondary | ICD-10-CM

## 2024-04-22 ENCOUNTER — Other Ambulatory Visit: Payer: Self-pay

## 2024-04-22 DIAGNOSIS — L700 Acne vulgaris: Secondary | ICD-10-CM

## 2024-06-30 ENCOUNTER — Encounter: Payer: Self-pay | Admitting: Student in an Organized Health Care Education/Training Program

## 2024-06-30 ENCOUNTER — Ambulatory Visit: Admitting: Student in an Organized Health Care Education/Training Program

## 2024-06-30 VITALS — BP 132/80 | HR 94 | Ht 71.3 in | Wt 226.5 lb

## 2024-06-30 DIAGNOSIS — F900 Attention-deficit hyperactivity disorder, predominantly inattentive type: Secondary | ICD-10-CM | POA: Diagnosis not present

## 2024-06-30 DIAGNOSIS — L720 Epidermal cyst: Secondary | ICD-10-CM | POA: Insufficient documentation

## 2024-06-30 NOTE — Progress Notes (Signed)
   Established Patient Office Visit  Patient ID: Jason Simpson, male    DOB: Apr 17, 2001  Age: 23 y.o. MRN: [id number] PCP: Jerrell Cleatus Ned, MD  Chief Complaint  Patient presents with   ADHD    Would like to discuss medications. Wants to retry the vyvanse  20mg .     Subjective:     HPI  Discussed the use of AI scribe software for clinical note transcription with the patient, who gave verbal consent to proceed.  History of Present Illness Jason Simpson is a 23 year old male who presents with a request to retry Vyvanse  at a higher dose for attention issues.  He wants to retry Vyvanse  at a slightly higher dose, as he recalls it having the best results for his attention issues, despite causing sleep disturbances. His work schedule now starts early in the morning, which he believes may help manage the timing of the medication's effects. He last took Vyvanse  in September and has about fifteen tablets remaining from a prescription given in July. He previously switched from atomoxetine  due to drowsiness and dizziness.  Financial difficulties have prevented him from purchasing his acne cream prescriptions. He is currently using Adapalene  0.1% topical at bedtime. Recently, he started working at [employer], which has helped alleviate some financial strain.  He mentions a bump on his head that has decreased in size and no longer feels like it's growing. He also notes a smaller bump forming, which he describes as a clogged follicle. No head trauma is reported, and a similar bump previously resolved after washing his hair.     Objective:     BP 132/80 (BP Location: Right Arm, Patient Position: Sitting, Cuff Size: Large)   Pulse 94   Ht 5' 11.3 (1.811 m)   Wt 226 lb 8 oz (102.7 kg)   SpO2 98%   BMI 31.33 kg/m   Physical Exam  Gen: Well-appearing young man Skin: On his scalp there is a 1 cm soft dermal cyst without erythema or drainage, it is freely  movable and nontender Heart: Regular, no murmur Lungs: Unlabored, clear throughout Ext: Warm, no edema Psych: Appropriate mood and affect, not anxious or depressed appearing    Assessment & Plan:   Problem List Items Addressed This Visit       High   ADHD - Primary (Chronic)   Vyvanse  was previously effective but inconsistently used due to financial issues and perceived inefficacy. Consistent use is necessary to evaluate efficacy and consider potential dose adjustment. There is a risk of side effects with abrupt dose increase. Restart Vyvanse  20mg  and use consistently on most days. Message me via MyChart with progress and side effects.  His pill bottle from July still has about 15 tablets.  Consider a dose increase if attention problems persist after consistent use. Follow up in six weeks to assess efficacy and side effects.        Low   Inclusion cyst   The benign epidermal cyst, likely an inclusion cyst, shows no infection or significant growth. Removal is cosmetic unless bothersome. Avoid manipulating the cyst to prevent inflammation. Leave the cyst alone unless it becomes bothersome, as removal is not medically necessary.       Return in about 6 weeks (around 08/11/2024).    Cleatus Ned Jerrell, MD [facility]

## 2024-06-30 NOTE — Assessment & Plan Note (Signed)
 The benign epidermal cyst, likely an inclusion cyst, shows no infection or significant growth. Removal is cosmetic unless bothersome. Avoid manipulating the cyst to prevent inflammation. Leave the cyst alone unless it becomes bothersome, as removal is not medically necessary.

## 2024-06-30 NOTE — Assessment & Plan Note (Signed)
 Vyvanse  was previously effective but inconsistently used due to financial issues and perceived inefficacy. Consistent use is necessary to evaluate efficacy and consider potential dose adjustment. There is a risk of side effects with abrupt dose increase. Restart Vyvanse  20mg  and use consistently on most days. Message me via MyChart with progress and side effects.  His pill bottle from July still has about 15 tablets.  Consider a dose increase if attention problems persist after consistent use. Follow up in six weeks to assess efficacy and side effects.

## 2024-06-30 NOTE — Patient Instructions (Signed)
  VISIT SUMMARY: During your visit, we discussed your attention issues and the request to retry Vyvanse  at a higher dose. We also addressed your financial difficulties in purchasing acne cream and examined the bump on your head.  YOUR PLAN: -ATTENTION-DEFICIT HYPERACTIVITY DISORDER, INATTENTIVE TYPE: This condition affects your ability to focus and pay attention. We will restart Vyvanse  and you should use it consistently on most days. Please message me via MyChart with your progress and any side effects. If attention problems persist after consistent use, we may consider a dose increase. We will follow up in six weeks to assess the medication's efficacy and any side effects.  -EPIDERMAL CYST OF SCALP: This is a benign bump on your scalp that is not infected or growing significantly. It is likely an inclusion cyst. You should avoid manipulating it to prevent inflammation. Removal is not medically necessary unless it becomes bothersome.  INSTRUCTIONS: Please follow up in six weeks to assess the efficacy of Vyvanse  and any side effects. Message me via MyChart with your progress and any side effects.

## 2024-08-14 ENCOUNTER — Encounter: Payer: Self-pay | Admitting: Student in an Organized Health Care Education/Training Program

## 2024-08-14 ENCOUNTER — Ambulatory Visit (INDEPENDENT_AMBULATORY_CARE_PROVIDER_SITE_OTHER): Admitting: Student in an Organized Health Care Education/Training Program

## 2024-08-14 VITALS — BP 122/68 | HR 84 | Temp 98.5°F | Ht 71.75 in | Wt 228.0 lb

## 2024-08-14 DIAGNOSIS — L0231 Cutaneous abscess of buttock: Secondary | ICD-10-CM | POA: Diagnosis not present

## 2024-08-14 DIAGNOSIS — F900 Attention-deficit hyperactivity disorder, predominantly inattentive type: Secondary | ICD-10-CM

## 2024-08-14 MED ORDER — SULFAMETHOXAZOLE-TRIMETHOPRIM 800-160 MG PO TABS: 1.0000 | ORAL_TABLET | Freq: Two times a day (BID) | ORAL | 0 refills | Status: AC

## 2024-08-14 MED ORDER — LISDEXAMFETAMINE DIMESYLATE 20 MG PO CAPS: 20.0000 mg | ORAL_CAPSULE | Freq: Every day | ORAL | 0 refills | Status: AC

## 2024-08-14 NOTE — Patient Instructions (Signed)
" °  VISIT SUMMARY: Today, we discussed your recurrent abscesses and medication follow-up. You have been experiencing painful abscesses between your buttocks, which have been increasing in size and pain. We also reviewed your current medication for attention issues and discussed its effectiveness and side effects.  YOUR PLAN: -CELLULITIS AND ABSCESS OF BUTTOCK: Cellulitis is a bacterial skin infection that can cause redness, swelling, and pain. Abscesses are collections of pus that can form in the infected area. Your recurrent abscesses are likely due to a staphylococcal infection, possibly MRSA. You have been prescribed Bactrim , one pill twice a day for seven days. Avoid self-draining the abscesses and seek medical attention if drainage is necessary. Maintain usual hygiene without excessive scrubbing. Return for evaluation if the pain recurs or worsens.  -ATTENTION-DEFICIT HYPERACTIVITY DISORDER, PREDOMINANTLY INATTENTIVE TYPE: ADHD is a condition that affects focus, attention, and self-control. Your current medication, Vyvanse , is effective for six to eight hours. We discussed the possibility of a longer-acting medication for school. Your Vyvanse  prescription has been refilled, and we will consider longer-acting options in the future.  INSTRUCTIONS: Please follow the prescribed course of Bactrim  for your abscesses and avoid self-draining them. Maintain your usual hygiene routine without excessive scrubbing. If the pain recurs or worsens, return for evaluation. Continue taking Vyvanse  as prescribed and monitor for any side effects. We will discuss longer-acting medication options for your ADHD in the future.    Contains text generated by [employer].   "

## 2024-08-14 NOTE — Assessment & Plan Note (Signed)
 Recurrent cellulitis and abscess on the left lower buttock, likely due to a staphylococcal infection, possibly MRSA. Excessive scrubbing may contribute to recurrence. Prescribed Bactrim , one pill twice a day for seven days. Advised against self-draining abscesses and instructed to seek medical attention for drainage if necessary. Recommended maintaining usual hygiene without excessive scrubbing. Instructed to return for evaluation if pain recurs or worsens.

## 2024-08-14 NOTE — Progress Notes (Signed)
 "  Established Patient Office Visit  Patient ID: Jason Simpson, male    DOB: 06-Sep-2000  Age: 24 y.o. MRN: [id number] PCP: Jerrell Cleatus Ned, MD  Chief Complaint  Patient presents with   Follow-up    Patient states he has been dealing with cyst in butt area. Thinks it is a folicle problem. Causes pain. States he has them right now. Asking for them to be checked.     Subjective:     HPI  Discussed the use of AI scribe software for clinical note transcription with the patient, who gave verbal consent to proceed.  History of Present Illness Jason Simpson is a 24 year old male who presents with recurrent abscesses and medication follow-up.  He has been experiencing recurrent abscesses located between his buttocks since the age of 17. These abscesses have been increasing in size and pain, sometimes rendering him bedridden. The current abscess began a few weeks ago and has started draining on its own. The pain is severe, especially when walking. He has been using a sanitized razor blade to puncture the abscesses for relief, but finds the pain debilitating. He notes a possible link between the abscesses and physical activity or chafing, as they occur more frequently when attending the gym. No trauma to the area or use of the area for sexual activity is reported.  He is currently taking Vyvanse  for attention issues, which he finds effective for about six to eight hours. He experiences an afternoon crash, which has been improving, and has been taking breaks from the medication, especially during non-work periods like holidays. No significant side effects such as heart racing, sleep disturbances, or anxiety are reported, although he notes a possible heart racing episode when he did not eat enough.  He has not used his inhaler recently and reports some wheezing. He has ordered new glasses but is waiting for a replacement due to sizing issues.      Objective:     BP 122/68    Pulse 84   Temp 98.5 F (36.9 C) (Oral)   Ht 5' 11.75 (1.822 m)   Wt 228 lb (103.4 kg)   SpO2 96%   BMI 31.14 kg/m    Physical Exam  Gen: Well appearing young man, first to stand because it is uncomfortable to sit. Neck: Normal thyroid, no nodules or adenopathy Heart: Regular, no murmur Lung unlabored, clear throughouts:  Buttocks: On his left lower buttocks he has a large area of induration, warmth, and tenderness with a small central erosion and drainage.  There is a another small erosion on the upper right buttocks but this has no erythema or induration or tenderness. Ext: Warm, no edema    Assessment & Plan:   Problem List Items Addressed This Visit       High   ADHD - Primary (Chronic)   Currently managed with Vyvanse , effective for six to eight hours. Occasional heart racing likely due to inadequate food intake. Interested in longer-acting medication for school. Refilled Vyvanse  prescription and discussed potential increasing the dose in the future once he returns to school.  Currently he is using it very rarely, 30 tablets in July is lasted up until now.  I refilled another 30 tablets and we will see him back in 3 months.  No adverse side effects and noticing some good benefits on the days he does use it.      Relevant Medications   lisdexamfetamine  (VYVANSE ) 20 MG capsule  Unprioritized   Abscess of left buttock   Recurrent cellulitis and abscess on the left lower buttock, likely due to a staphylococcal infection, possibly MRSA. Excessive scrubbing may contribute to recurrence. Prescribed Bactrim , one pill twice a day for seven days. Advised against self-draining abscesses and instructed to seek medical attention for drainage if necessary. Recommended maintaining usual hygiene without excessive scrubbing. Instructed to return for evaluation if pain recurs or worsens.      Relevant Medications   sulfamethoxazole -trimethoprim  (BACTRIM  DS) 800-160 MG tablet     Return in about 3 months (around 11/12/2024).    Cleatus Debby Specking, MD [facility]   "

## 2024-08-14 NOTE — Assessment & Plan Note (Addendum)
 Currently managed with Vyvanse , effective for six to eight hours. Occasional heart racing likely due to inadequate food intake. Interested in longer-acting medication for school. Refilled Vyvanse  prescription and discussed potential increasing the dose in the future once he returns to school.  Currently he is using it very rarely, 30 tablets in July is lasted up until now.  I refilled another 30 tablets and we will see him back in 3 months.  No adverse side effects and noticing some good benefits on the days he does use it.

## 2024-08-24 ENCOUNTER — Telehealth: Payer: Self-pay | Admitting: Pharmacy Technician

## 2024-08-24 ENCOUNTER — Other Ambulatory Visit (HOSPITAL_COMMUNITY): Payer: Self-pay

## 2024-08-24 NOTE — Telephone Encounter (Signed)
 Pharmacy Patient Advocate Encounter   Received notification from [employer] that prior authorization for Lisdexamfetamine  Dimesylate 20MG  capsules is required/requested.   Insurance verification completed.   The patient is insured through [employer].   Per test claim: PA required; PA submitted to above mentioned insurance via Latent Key/confirmation #/EOC [id number] Status is pending

## 2024-08-24 NOTE — Telephone Encounter (Signed)
 Pharmacy Patient Advocate Encounter  Received notification from [facility] that Prior Authorization for Lisdexamfetamine  Dimesylate 20MG  capsules has been APPROVED from 08/24/2024 to 08/24/2025. Ran test claim, Copay is $15.00. This test claim was processed through [facility]- copay amounts may vary at other pharmacies due to pharmacy/plan contracts, or as the patient moves through the different stages of their insurance plan.   PA #/Case ID/Reference #: [id number]

## 2024-11-16 ENCOUNTER — Ambulatory Visit: Admitting: Student in an Organized Health Care Education/Training Program
